# Patient Record
Sex: Female | Born: 1935 | Race: Black or African American | Hispanic: No | Marital: Single | Smoking: Former smoker
Health system: Southern US, Community
[De-identification: ages and names within clinical notes are randomized; demographics above are authoritative.]

## PROBLEM LIST (undated history)

## (undated) DIAGNOSIS — E119 Type 2 diabetes mellitus without complications: Secondary | ICD-10-CM

## (undated) DIAGNOSIS — I10 Essential (primary) hypertension: Secondary | ICD-10-CM

## (undated) DIAGNOSIS — D649 Anemia, unspecified: Secondary | ICD-10-CM

---

## 2006-03-17 ENCOUNTER — Emergency Department: Payer: Self-pay | Admitting: Emergency Medicine

## 2009-07-11 ENCOUNTER — Ambulatory Visit: Payer: Self-pay | Admitting: Family Medicine

## 2011-07-28 ENCOUNTER — Ambulatory Visit: Payer: Self-pay

## 2011-12-27 ENCOUNTER — Ambulatory Visit: Payer: Self-pay | Admitting: Internal Medicine

## 2013-03-24 ENCOUNTER — Ambulatory Visit: Payer: Self-pay | Admitting: Emergency Medicine

## 2013-05-02 ENCOUNTER — Ambulatory Visit: Payer: Self-pay | Admitting: Physician Assistant

## 2013-05-11 ENCOUNTER — Ambulatory Visit: Payer: Self-pay

## 2013-08-18 ENCOUNTER — Ambulatory Visit: Payer: Self-pay | Admitting: Family Medicine

## 2013-08-18 LAB — URINALYSIS, COMPLETE
Glucose,UR: NEGATIVE mg/dL (ref 0–75)
KETONE: NEGATIVE
LEUKOCYTE ESTERASE: NEGATIVE
Nitrite: NEGATIVE
Ph: 6 (ref 4.5–8.0)
Protein: 300
Specific Gravity: 1.025 (ref 1.003–1.030)

## 2013-08-18 LAB — CBC WITH DIFFERENTIAL/PLATELET
BASOS ABS: 0.1 10*3/uL (ref 0.0–0.1)
Basophil %: 0.9 %
EOS PCT: 4 %
Eosinophil #: 0.3 10*3/uL (ref 0.0–0.7)
HCT: 37.8 % (ref 35.0–47.0)
HGB: 11.8 g/dL — ABNORMAL LOW (ref 12.0–16.0)
LYMPHS PCT: 25.7 %
Lymphocyte #: 2 10*3/uL (ref 1.0–3.6)
MCH: 26.1 pg (ref 26.0–34.0)
MCHC: 31.2 g/dL — AB (ref 32.0–36.0)
MCV: 84 fL (ref 80–100)
Monocyte #: 0.5 x10 3/mm (ref 0.2–0.9)
Monocyte %: 6.2 %
NEUTROS PCT: 63.2 %
Neutrophil #: 4.9 10*3/uL (ref 1.4–6.5)
Platelet: 172 10*3/uL (ref 150–440)
RBC: 4.53 10*6/uL (ref 3.80–5.20)
RDW: 14.8 % — AB (ref 11.5–14.5)
WBC: 7.7 10*3/uL (ref 3.6–11.0)

## 2013-08-18 LAB — BASIC METABOLIC PANEL
ANION GAP: 6 — AB (ref 7–16)
BUN: 27 mg/dL — ABNORMAL HIGH (ref 7–18)
CO2: 32 mmol/L (ref 21–32)
CREATININE: 1.85 mg/dL — AB (ref 0.60–1.30)
Calcium, Total: 9.1 mg/dL (ref 8.5–10.1)
Chloride: 103 mmol/L (ref 98–107)
EGFR (African American): 27 — ABNORMAL LOW
GFR CALC NON AF AMER: 23 — AB
Glucose: 110 mg/dL — ABNORMAL HIGH (ref 65–99)
OSMOLALITY: 287 (ref 275–301)
Potassium: 4.2 mmol/L (ref 3.5–5.1)
SODIUM: 141 mmol/L (ref 136–145)

## 2014-07-09 ENCOUNTER — Ambulatory Visit: Payer: Self-pay | Admitting: Physician Assistant

## 2015-01-16 ENCOUNTER — Ambulatory Visit
Admission: EM | Admit: 2015-01-16 | Discharge: 2015-01-16 | Disposition: A | Discharge status: Home / Self Care | Payer: Medicare Other | Attending: Internal Medicine | Admitting: Internal Medicine

## 2015-01-16 ENCOUNTER — Ambulatory Visit: Payer: Medicare Other

## 2015-01-16 DIAGNOSIS — J209 Acute bronchitis, unspecified: Secondary | ICD-10-CM

## 2015-01-16 HISTORY — DX: Essential (primary) hypertension: I10

## 2015-01-16 HISTORY — DX: Anemia, unspecified: D64.9

## 2015-01-16 HISTORY — DX: Type 2 diabetes mellitus without complications: E11.9

## 2015-01-16 MED ORDER — AZITHROMYCIN 500 MG PO TABS
500.0000 mg | ORAL_TABLET | Freq: Once | ORAL | Status: AC
  Administered 2015-01-16: 500 mg via ORAL

## 2015-01-16 MED ORDER — IPRATROPIUM-ALBUTEROL 0.5-2.5 (3) MG/3ML IN SOLN
3.0000 mL | Freq: Once | RESPIRATORY_TRACT | Status: AC
  Administered 2015-01-16: 3 mL via RESPIRATORY_TRACT

## 2015-01-16 MED ORDER — IPRATROPIUM-ALBUTEROL 20-100 MCG/ACT IN AERS: 1.0000 | INHALATION_SPRAY | Freq: Four times a day (QID) | RESPIRATORY_TRACT | Status: AC | PRN

## 2015-01-16 MED ORDER — AZITHROMYCIN 250 MG PO TABS: 250.0000 mg | ORAL_TABLET | Freq: Every day | ORAL | Status: DC

## 2015-01-16 MED ORDER — PREDNISONE 50 MG PO TABS: 50.0000 mg | ORAL_TABLET | Freq: Every day | ORAL | Status: DC

## 2015-01-16 MED ORDER — METHYLPREDNISOLONE SODIUM SUCC 125 MG IJ SOLR
125.0000 mg | Freq: Once | INTRAMUSCULAR | Status: AC
  Administered 2015-01-16: 125 mg via INTRAMUSCULAR

## 2015-01-16 NOTE — Discharge Instructions (Signed)
Prescriptions for zithromax, prednisone, and an  ipratropium/albuterol inhaler were printed and given today.   Chest xray was unremarkable.   A breathing treatment with ipratropium/albuterol was given at the urgent care today.   Recheck or followup pcp/Bess White if not starting to improve in a few days,  for increasing breathlessness/wheezing, or new fever >100.5.   Anticipate gradual improvement in coughing over the next 2-3 weeks.  Acute Bronchitis Bronchitis is when the airways that extend from the windpipe into the lungs get red, puffy, and painful (inflamed). Bronchitis often causes thick spit (mucus) to develop. This leads to a cough. A cough is the most common symptom of bronchitis. In acute bronchitis, the condition usually begins suddenly and goes away over time (usually in 2 weeks). Smoking, allergies, and asthma can make bronchitis worse. Repeated episodes of bronchitis may cause more lung problems. HOME CARE  Rest.  Drink enough fluids to keep your pee (urine) clear or pale yellow (unless you need to limit fluids as told by your doctor).  Only take over-the-counter or prescription medicines as told by your doctor.  Avoid smoking and secondhand smoke. These can make bronchitis worse. If you are a smoker, think about using nicotine gum or skin patches. Quitting smoking will help your lungs heal faster.  Reduce the chance of getting bronchitis again by:  Washing your hands often.  Avoiding people with cold symptoms.  Trying not to touch your hands to your mouth, nose, or eyes.  Follow up with your doctor as told. GET HELP IF: Your symptoms do not improve after 1 week of treatment. Symptoms include:  Cough.  Fever.  Coughing up thick spit.  Body aches.  Chest congestion.  Chills.  Shortness of breath.  Sore throat. GET HELP RIGHT AWAY IF:   You have an increased fever.  You have chills.  You have severe shortness of breath.  You have bloody thick spit  (sputum).  You throw up (vomit) often.  You lose too much body fluid (dehydration).  You have a severe headache.  You faint. MAKE SURE YOU:   Understand these instructions.  Will watch your condition.  Will get help right away if you are not doing well or get worse. Document Released: 10/17/2007 Document Revised: 12/31/2012 Document Reviewed: 10/21/2012 [facility] Patient Information 2015 [place], [place]. This information is not intended to replace advice given to you by your health care provider. Make sure you discuss any questions you have with your health care provider.

## 2015-01-16 NOTE — ED Notes (Signed)
Started with cough 2 weeks ago. Now SOB with exertion. Non productive cough

## 2015-01-16 NOTE — ED Provider Notes (Signed)
CSN: [id number]     Arrival date & time 01/16/15  1352 History   First MD Initiated Contact with Patient 01/16/15 1547     Chief Complaint  Patient presents with  . Cough  . Shortness of Breath   Patient is a 81 y.o. female presenting with cough and shortness of breath.  Cough Associated symptoms: shortness of breath   Shortness of Breath Associated symptoms: cough     Paroxysms of dry coughing in the last week particularly. Up all night coughing. Minimal runny nose, clear. Not much sinus congestion. Appetite is poor, but she has had no vomiting. No fever. Maybe a little dyspnea on exertion.  Former smoker. Similar symptoms in the past have led to hospitalization.  Past Medical History  Diagnosis Date  . Anemia   . Diabetes mellitus without complication   . Hypertension    History reviewed. No pertinent past surgical history. History reviewed. No pertinent family history. Social History  Substance Use Topics  . Smoking status: Former Games developer  . Smokeless tobacco: None  . Alcohol Use: No    Review of Systems  Respiratory: Positive for cough and shortness of breath.     Allergies  Review of patient's allergies indicates no known allergies.  Home Medications   Prior to Admission medications   Medication Sig Start Date End Date Taking? Authorizing Provider  acetaminophen (TYLENOL) 500 MG tablet Take 500 mg by mouth every 6 (six) hours as needed.   Yes Historical Provider, MD  amLODipine (NORVASC) 10 MG tablet Take 10 mg by mouth daily.   Yes Historical Provider, MD  dextromethorphan-guaiFENesin (MUCINEX DM) 30-600 MG per 12 hr tablet Take 1 tablet by mouth 2 (two) times daily.   Yes Historical Provider, MD  ferrous sulfate 220 (44 FE) MG/5ML solution Take 220 mg by mouth daily.   Yes Historical Provider, MD   Meds Ordered and Administered this Visit   Medications  ipratropium-albuterol (DUONEB) 0.5-2.5 (3) MG/3ML nebulizer solution 3 mL (3 mLs Nebulization Given 01/16/15  1620)  methylPREDNISolone sodium succinate (SOLU-MEDROL) 125 mg/2 mL injection 125 mg (125 mg Intramuscular Given 01/16/15 1645)  azithromycin (ZITHROMAX) tablet 500 mg (500 mg Oral Given 01/16/15 1645)    BP 146/61 mmHg  Pulse 77  Temp(Src) 98.7 F (37.1 C) (Tympanic)  Resp 34  Ht  (1.626 m)  Wt 116 lb (52.617 kg)  BMI 19.90 kg/m2  SpO2 96% No data found.   Physical Exam  Constitutional: She is oriented to person, place, and time. No distress.  Alert, nicely groomed  HENT:  Head: Atraumatic.  Ears are little waxy, difficult to see the tympanic membranes. Mild nasal congestion, throat is pink  Eyes:  Conjugate gaze, no eye redness/drainage  Neck:  Able to turn head freely  Cardiovascular: Normal rate and regular rhythm.   Pulmonary/Chest: No respiratory distress. She has no wheezes. She has no rales.  Coarse breath sounds, quite diminished posteriorly, symmetric breath sounds, mild splinting, very frequent coughing during exam  Abdominal: She exhibits no distension.  Musculoskeletal: She exhibits no edema.  No leg swelling  Neurological: She is alert and oriented to person, place, and time.  Skin: Skin is warm and dry.  No cyanosis  Nursing note and vitals reviewed.   ED Course  Procedures   Imaging Review Dg Chest 2 View  01/16/2015   CLINICAL DATA:  Cough and shortness of breath for 1 week.  EXAM: CHEST  2 VIEW  COMPARISON:  07/09/2014.  FINDINGS: Decreased  depth of inspiration with continued overall hyperexpansion of the lungs. The lungs are clear. No gross change in a normal sized heart. Unremarkable bones.  IMPRESSION: No acute abnormality.  COPD   Electronically Signed   By: Beckie Salts M.D.   On: 01/16/2015 15:56     MDM   1. Acute bronchitis, unspecified organism    Because the pharmacy closures for the holiday weekend, patient was given first doses of medication tonight: Zithromax 500 mg by mouth, SoluMedrol injection IM 125 mg. She had a DuoNeb  breathing treatment, which provided some relief of a coughing spasm, and also improved posterior air movement.  Discharge Medication List as of 01/16/2015  4:57 PM    START taking these medications   Details  azithromycin (ZITHROMAX) 250 MG tablet Take 1 tablet (250 mg total) by mouth daily. Take first 2 tablets together, then 1 every day until finished., Starting 01/16/2015, Until Discontinued, Print    Ipratropium-Albuterol (COMBIVENT RESPIMAT) 20-100 MCG/ACT AERS respimat Inhale 1 puff into the lungs 4 (four) times daily as needed for wheezing or shortness of breath., Starting 01/16/2015, Until Discontinued, Print    predniSONE (DELTASONE) 50 MG tablet Take 1 tablet (50 mg total) by mouth daily., Starting 01/16/2015, Until Discontinued, Print       Anticipate improvement in wheezing and mild breathlessness in the next couple of days. Anticipate gradual improvement in cough over the next 2-3 weeks. Recheck or follow up PCP/Bess White for new fever greater than 100.5, increasing phlegm production.  Eustace Moore, MD 01/16/15 [phone]

## 2015-02-07 ENCOUNTER — Ambulatory Visit
Admission: EM | Admit: 2015-02-07 | Discharge: 2015-02-07 | Disposition: A | Discharge status: Home / Self Care | Payer: Medicare Other | Attending: Emergency Medicine | Admitting: Emergency Medicine

## 2015-02-07 ENCOUNTER — Ambulatory Visit: Payer: Medicare Other

## 2015-02-07 DIAGNOSIS — R059 Cough, unspecified: Secondary | ICD-10-CM

## 2015-02-07 DIAGNOSIS — R05 Cough: Secondary | ICD-10-CM | POA: Diagnosis not present

## 2015-02-07 MED ORDER — PREDNISONE 50 MG PO TABS: 50.0000 mg | ORAL_TABLET | Freq: Every day | ORAL | Status: AC

## 2015-02-07 MED ORDER — DOXYCYCLINE HYCLATE 100 MG PO CAPS: 100.0000 mg | ORAL_CAPSULE | Freq: Two times a day (BID) | ORAL | Status: AC

## 2015-02-07 MED ORDER — BENZONATATE 100 MG PO CAPS: 100.0000 mg | ORAL_CAPSULE | Freq: Three times a day (TID) | ORAL | Status: AC

## 2015-02-07 NOTE — ED Notes (Signed)
Seen here at [facility] on 01/16/15 and Dx with bronchitis. States continues to have a non productive choky cough

## 2015-02-07 NOTE — ED Provider Notes (Signed)
HPI  SUBJECTIVE:  Stephanie Salinas is a 81 y.o. female who presents with persistent cough for the past 2 weeks. She states that she had the same symptoms earlier this month, but it never completely resolved. Reports some shortness of breath with exertion. Patient was seen here in 9/4 for the same, diagnosed with bronchitis. She was given Solu-Medrol, DuoNeb with improvement, sent home with azithromycin, prednisone, combivent with anticipation of improvement of symptoms in 2-3 weeks.  Symptoms are worse at night, better with a Combivent which she is using 3 times a day. She has not tried anything else for this. No nausea, vomiting, fevers, chest pain, shortness of breath, wheezing, lower extremity edema, nocturia, PND, unintentional weight gain. No postnasal drip, nasal congestion, sore throat, acid reflux type symptoms. She is not on an ACE inhibitor.  Past medical history of diabetes, hypertension, bronchitis, pneumonia, former smoker.. No history of asthma, emphysema, COPD, pneumonia, congestive heart failure, cardiomyopathy. She is a former smoker.  Past Medical History  Diagnosis Date  . Anemia   . Diabetes mellitus without complication   . Hypertension     History reviewed. No pertinent past surgical history.  History reviewed. No pertinent family history.  Social History  Substance Use Topics  . Smoking status: Former Games developer  . Smokeless tobacco: Current User    Types: Snuff  . Alcohol Use: No    No current facility-administered medications for this encounter.  Current outpatient prescriptions:  .  acetaminophen (TYLENOL) 500 MG tablet, Take 500 mg by mouth every 6 (six) hours as needed., Disp: , Rfl:  .  amLODipine (NORVASC) 10 MG tablet, Take 10 mg by mouth daily., Disp: , Rfl:  .  ferrous sulfate 220 (44 FE) MG/5ML solution, Take 220 mg by mouth daily., Disp: , Rfl:  .  Ipratropium-Albuterol (COMBIVENT RESPIMAT) 20-100 MCG/ACT AERS respimat, Inhale 1 puff into the  lungs 4 (four) times daily as needed for wheezing or shortness of breath., Disp: 4 g, Rfl: 0 .  benzonatate (TESSALON) 100 MG capsule, Take 1 capsule (100 mg total) by mouth every 8 (eight) hours., Disp: 21 capsule, Rfl: 0 .  doxycycline (VIBRAMYCIN) 100 MG capsule, Take 1 capsule (100 mg total) by mouth 2 (two) times daily. X 10 days, Disp: 20 capsule, Rfl: 0 .  predniSONE (DELTASONE) 50 MG tablet, Take 1 tablet (50 mg total) by mouth daily., Disp: 5 tablet, Rfl: 0  No Known Allergies   ROS  As noted in HPI.   Physical Exam  BP 144/54 mmHg  Pulse 68  Temp(Src) 99.1 F (37.3 C) (Tympanic)  Resp 20  Ht  (1.626 m)  Wt 116 lb (52.617 kg)  BMI 19.90 kg/m2  SpO2 97%    Constitutional: Well developed, well nourished, no acute distress. Speaking in full sentences. Eyes: PERRL, EOMI, conjunctiva normal bilaterally HENT: Normocephalic, atraumatic,mucus membranes moist. No nasal congestion  Respiratory: Faint crackles left lower lung. Fair air movement. no rales, no wheezing, no rhonchi Cardiovascular: Normal rate and rhythm, no murmurs, no gallops, no rubs GI: Soft, nondistended, normal bowel sounds, nontender, no rebound, no guarding Back: no CVAT skin: No rash, skin intact Musculoskeletal: No edema, no tenderness, no deformities Neurologic: Alert & oriented x 3, CN II-XII grossly intact, no motor deficits, sensation grossly intact Psychiatric: Speech and behavior appropriate   ED Course   Medications - No data to display  Orders Placed This Encounter  Procedures  . DG Chest 2 View    Standing Status: Standing  Number of Occurrences: 1     Standing Expiration Date:     Order Specific Question:  Reason for Exam (SYMPTOM  OR DIAGNOSIS REQUIRED)    Answer:  Cough x 4 weeks   No results found for this or any previous visit (from the past 24 hour(s)). Dg Chest 2 View  02/07/2015   CLINICAL DATA:  Four-week history of cough  EXAM: CHEST  2 VIEW  COMPARISON:  January 16, 2015  FINDINGS: There is a degree of lung hyperexpansion. There is no edema or consolidation. Heart size and pulmonary vascularity are within normal limits. No adenopathy. No bone lesions.  IMPRESSION: Lungs mildly hyperexpanded.  No edema or consolidation.   Electronically Signed   By: Bretta Bang III M.D.   On: 02/07/2015 14:03   Dg Chest 2 View  02/07/2015   CLINICAL DATA:  Four-week history of cough  EXAM: CHEST  2 VIEW  COMPARISON:  January 16, 2015  FINDINGS: There is a degree of lung hyperexpansion. There is no edema or consolidation. Heart size and pulmonary vascularity are within normal limits. No adenopathy. No bone lesions.  IMPRESSION: Lungs mildly hyperexpanded.  No edema or consolidation.   Electronically Signed   By: Bretta Bang III M.D.   On: 02/07/2015 14:03 ED Clinical Impression  Cough  ED Assessment/Plan Previous records reviewed. As noted in HPI.   Reviewed labs, imaging independently. Hyperexpansion. no pneumonia.See radiology report for full details.  She is satting well. Vitals normal. Fair air movement. Suspect residual cough, inflammation from bronchitis rather than pneumonia or  congestive heart failure. Doubt reflux, postnasal drip. patient still has crackles in her left lower lobe and given that she gets better with inhaled steroids/ beta agonist, we'll have her increase her Combivent to 4 times a day, will repeat prednisone, Tessalon Perles. Given her age she is at risk for pneumonia, but Given the absence of pneumonia on today's exam we'll treat her with doxycycline 100 mg twice a day. .   Discussed imaging, MDM, plan and followup with patient. discussed sn/sx that should prompt return to the UC or ED. Patient agrees with plan.   *This clinic note was created using Dragon dictation software. Therefore, there may be occasional mistakes despite careful proofreading.  ?   Domenick Gong, MD 02/07/15 2152

## 2015-02-07 NOTE — Discharge Instructions (Signed)
I think that this cough is residual inflammation from your recent bronchitis. There is no evidence of pneumonia. Use the Combivent inhaler on a regular basis, 4 times a day. Use the [employer] as needed for cough. I am starting you on antibiotics because you are at risk for getting a pneumonia. Follow-up with your doctor in the next several days. Return here if you get worse.

## 2017-04-21 IMAGING — CR DG CHEST 2V
2 series · 2 of 2 positions shown · non-contrast
Comparison: January 16, 2015

CLINICAL DATA: Four-week history of cough

EXAM:
CHEST  2 VIEW

[chest pa]
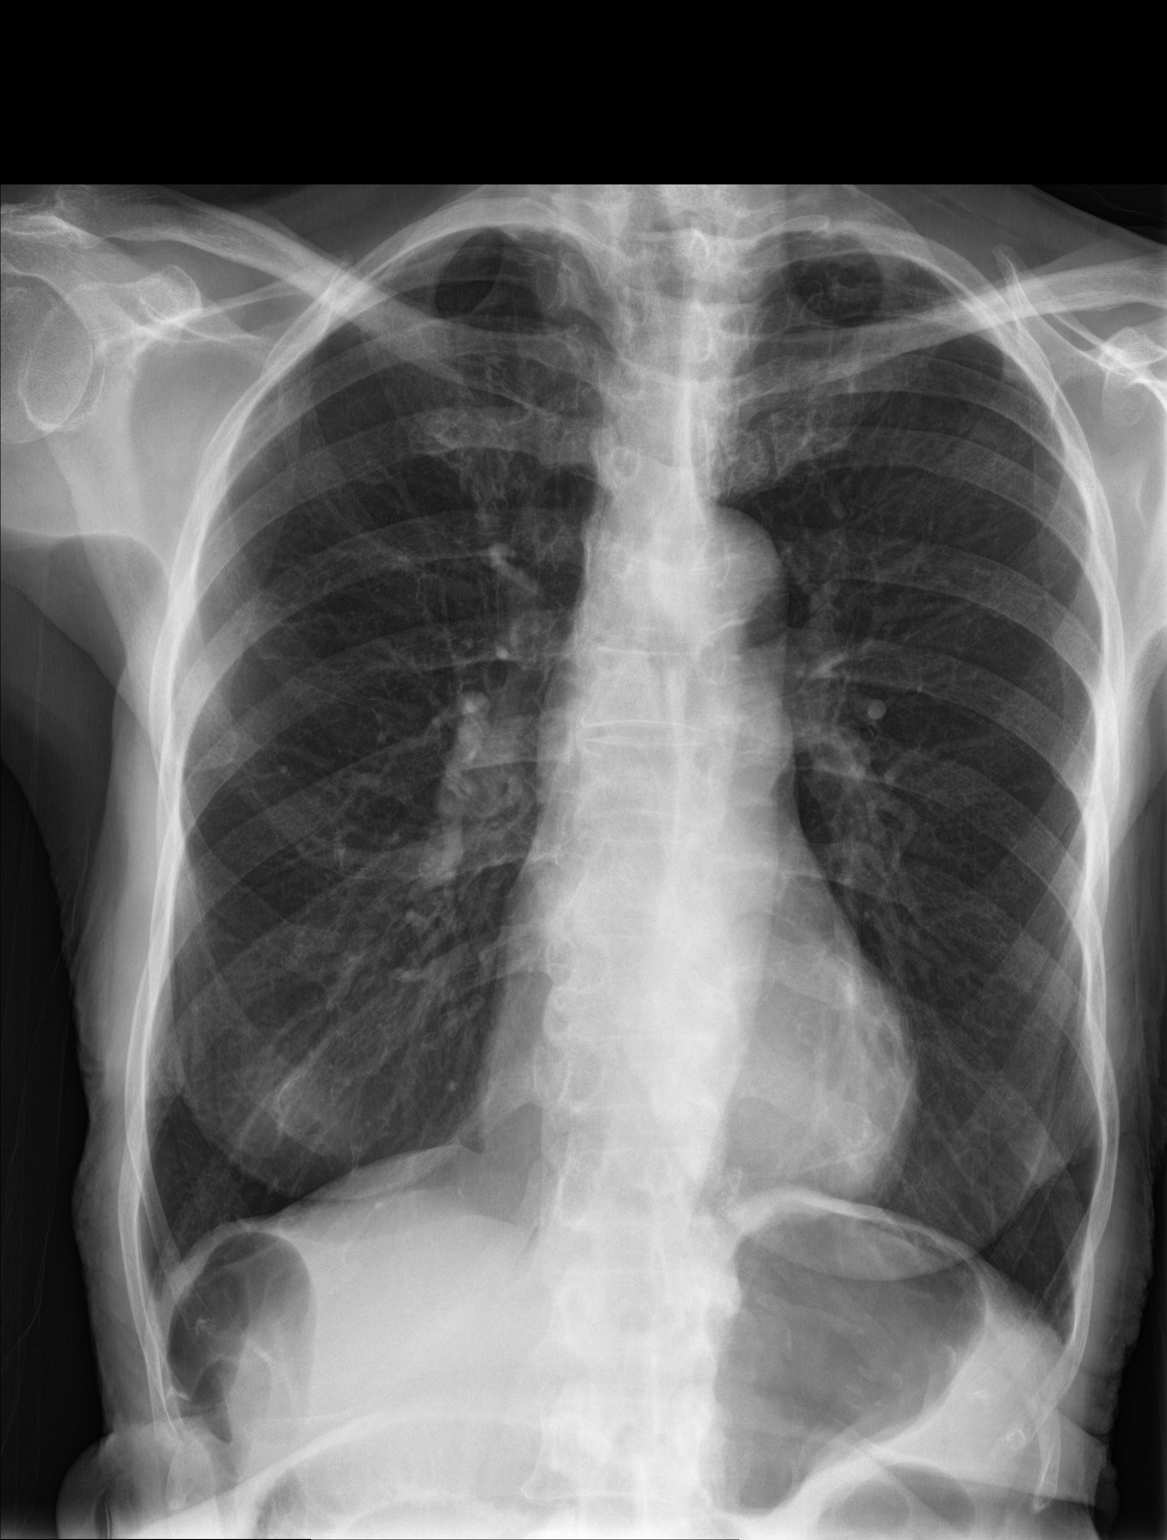

[chest lat]
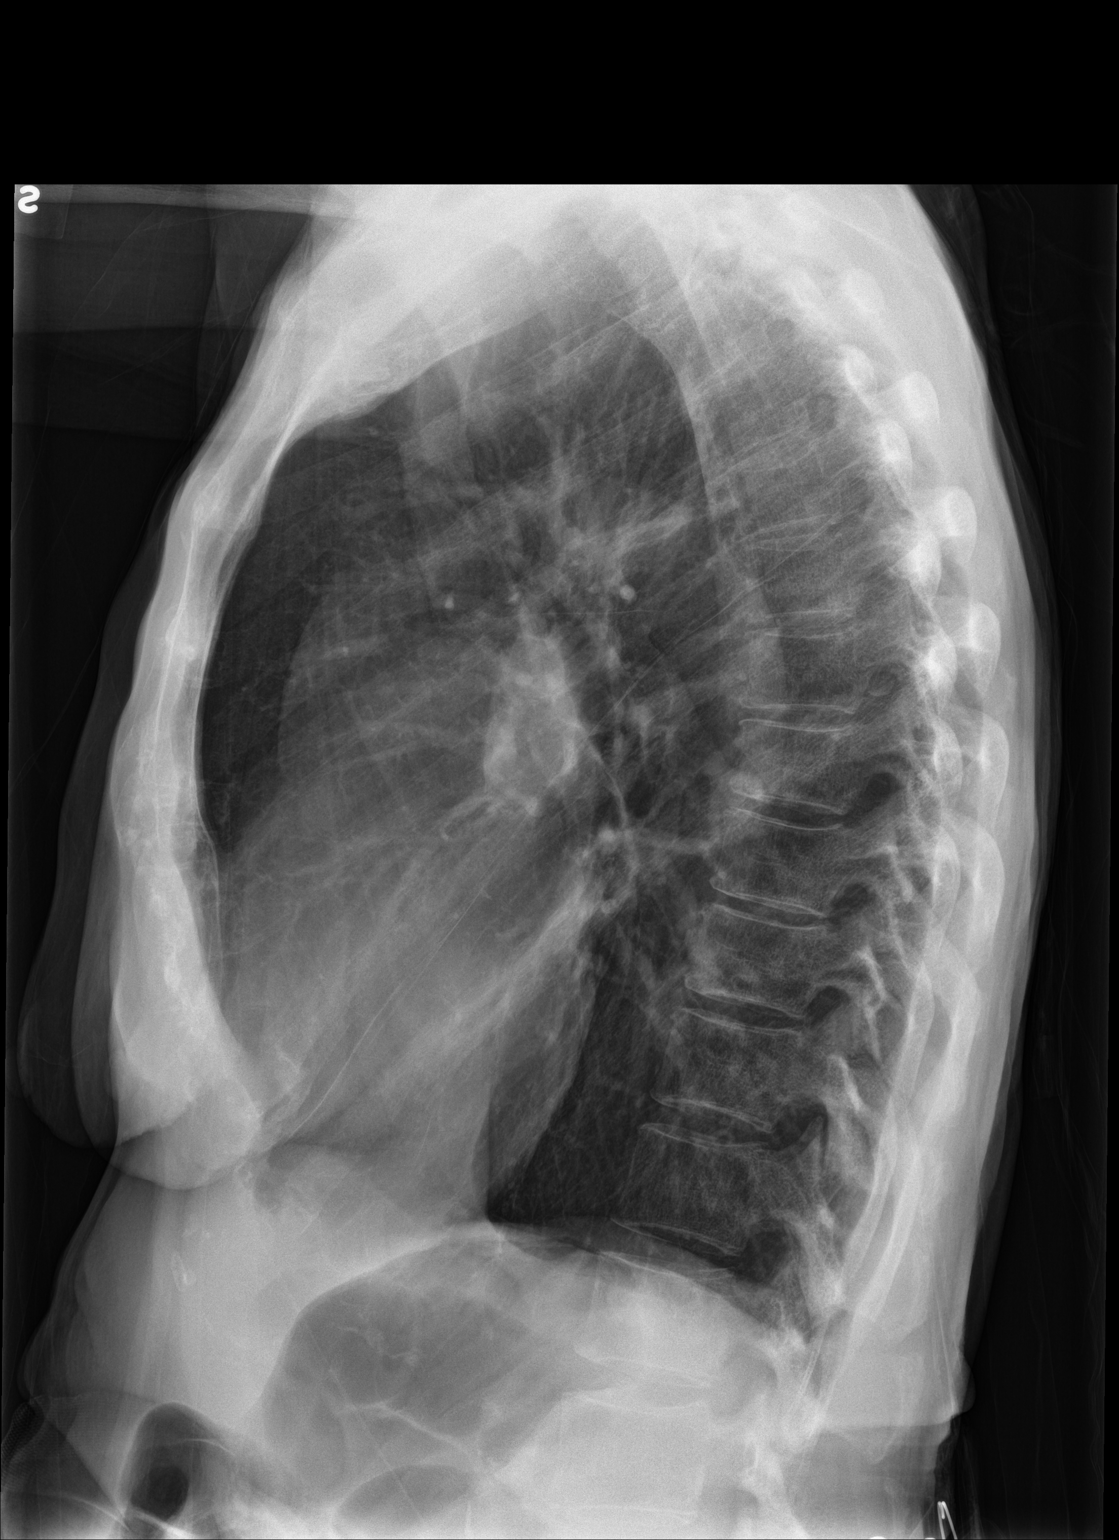

[2 of 2 positions shown; findings below may reference images not displayed]

FINDINGS: There is a degree of lung hyperexpansion. There is no edema or
consolidation. Heart size and pulmonary vascularity are within
normal limits. No adenopathy. No bone lesions.
IMPRESSION: Lungs mildly hyperexpanded.  No edema or consolidation.

## 2019-05-15 DEATH — deceased
